# Patient Record
Sex: Male | Born: 1950 | Race: White | Hispanic: No | Marital: Single | Smoking: Never smoker
Health system: Southern US, Community
[De-identification: ages and names within clinical notes are randomized; demographics above are authoritative.]

## PROBLEM LIST (undated history)

## (undated) DIAGNOSIS — I10 Essential (primary) hypertension: Secondary | ICD-10-CM

## (undated) DIAGNOSIS — G8929 Other chronic pain: Secondary | ICD-10-CM

## (undated) DIAGNOSIS — F1010 Alcohol abuse, uncomplicated: Secondary | ICD-10-CM

## (undated) DIAGNOSIS — N200 Calculus of kidney: Secondary | ICD-10-CM

## (undated) DIAGNOSIS — M109 Gout, unspecified: Secondary | ICD-10-CM

## (undated) HISTORY — DX: Gout, unspecified: M10.9

## (undated) HISTORY — DX: Essential (primary) hypertension: I10

## (undated) HISTORY — DX: Calculus of kidney: N20.0

## (undated) HISTORY — DX: Alcohol abuse, uncomplicated: F10.10

## (undated) HISTORY — DX: Other chronic pain: G89.29

---

## 2013-08-20 ENCOUNTER — Emergency Department: Payer: Self-pay | Admitting: Emergency Medicine

## 2013-08-20 LAB — COMPREHENSIVE METABOLIC PANEL
Albumin: 3.8 g/dL (ref 3.4–5.0)
Alkaline Phosphatase: 90 U/L (ref 50–136)
Anion Gap: 9 (ref 7–16)
BUN: 12 mg/dL (ref 7–18)
Bilirubin,Total: 1.2 mg/dL — ABNORMAL HIGH (ref 0.2–1.0)
Calcium, Total: 9.4 mg/dL (ref 8.5–10.1)
Co2: 26 mmol/L (ref 21–32)
Creatinine: 1.59 mg/dL — ABNORMAL HIGH (ref 0.60–1.30)
EGFR (African American): 52 — ABNORMAL LOW
EGFR (Non-African Amer.): 45 — ABNORMAL LOW
Glucose: 143 mg/dL — ABNORMAL HIGH (ref 65–99)
Osmolality: 274 (ref 275–301)
Potassium: 3.7 mmol/L (ref 3.5–5.1)
SGPT (ALT): 74 U/L (ref 12–78)
Sodium: 136 mmol/L (ref 136–145)
Total Protein: 7.1 g/dL (ref 6.4–8.2)

## 2013-08-20 LAB — CBC
HGB: 14.9 g/dL (ref 13.0–18.0)
MCHC: 35.2 g/dL (ref 32.0–36.0)
Platelet: 76 10*3/uL — ABNORMAL LOW (ref 150–440)
RBC: 4.43 10*6/uL (ref 4.40–5.90)
RDW: 13.2 % (ref 11.5–14.5)
WBC: 4.2 10*3/uL (ref 3.8–10.6)

## 2013-08-20 LAB — URINALYSIS, COMPLETE
Bacteria: NONE SEEN
Leukocyte Esterase: NEGATIVE
Nitrite: NEGATIVE
Ph: 7 (ref 4.5–8.0)
Protein: NEGATIVE

## 2013-08-20 LAB — TROPONIN I: Troponin-I: <0.02 ng/mL

## 2013-08-20 LAB — LIPASE, BLOOD: Lipase: 130 U/L (ref 73–393)

## 2014-12-25 DIAGNOSIS — R7309 Other abnormal glucose: Secondary | ICD-10-CM | POA: Diagnosis not present

## 2014-12-25 DIAGNOSIS — Z23 Encounter for immunization: Secondary | ICD-10-CM | POA: Diagnosis not present

## 2014-12-25 DIAGNOSIS — I10 Essential (primary) hypertension: Secondary | ICD-10-CM | POA: Diagnosis not present

## 2014-12-25 DIAGNOSIS — Z125 Encounter for screening for malignant neoplasm of prostate: Secondary | ICD-10-CM | POA: Diagnosis not present

## 2014-12-25 DIAGNOSIS — N183 Chronic kidney disease, stage 3 (moderate): Secondary | ICD-10-CM | POA: Diagnosis not present

## 2014-12-25 DIAGNOSIS — H6120 Impacted cerumen, unspecified ear: Secondary | ICD-10-CM | POA: Diagnosis not present

## 2014-12-25 DIAGNOSIS — M109 Gout, unspecified: Secondary | ICD-10-CM | POA: Diagnosis not present

## 2018-01-05 DIAGNOSIS — S0990XA Unspecified injury of head, initial encounter: Secondary | ICD-10-CM | POA: Diagnosis not present

## 2018-01-05 DIAGNOSIS — R410 Disorientation, unspecified: Secondary | ICD-10-CM | POA: Diagnosis not present

## 2018-01-05 DIAGNOSIS — N179 Acute kidney failure, unspecified: Secondary | ICD-10-CM | POA: Diagnosis not present

## 2018-01-05 DIAGNOSIS — R251 Tremor, unspecified: Secondary | ICD-10-CM | POA: Diagnosis not present

## 2018-01-05 DIAGNOSIS — S0083XA Contusion of other part of head, initial encounter: Secondary | ICD-10-CM | POA: Diagnosis not present

## 2018-01-07 DIAGNOSIS — K7689 Other specified diseases of liver: Secondary | ICD-10-CM | POA: Diagnosis not present

## 2018-01-07 DIAGNOSIS — R932 Abnormal findings on diagnostic imaging of liver and biliary tract: Secondary | ICD-10-CM | POA: Diagnosis not present

## 2018-01-08 DIAGNOSIS — D696 Thrombocytopenia, unspecified: Secondary | ICD-10-CM | POA: Diagnosis not present

## 2018-01-08 DIAGNOSIS — G9349 Other encephalopathy: Secondary | ICD-10-CM | POA: Diagnosis not present

## 2018-01-08 DIAGNOSIS — N189 Chronic kidney disease, unspecified: Secondary | ICD-10-CM | POA: Diagnosis not present

## 2018-01-08 DIAGNOSIS — N179 Acute kidney failure, unspecified: Secondary | ICD-10-CM | POA: Diagnosis not present

## 2018-01-09 DIAGNOSIS — M7989 Other specified soft tissue disorders: Secondary | ICD-10-CM | POA: Diagnosis not present

## 2018-01-09 DIAGNOSIS — R918 Other nonspecific abnormal finding of lung field: Secondary | ICD-10-CM | POA: Diagnosis not present

## 2018-01-09 DIAGNOSIS — I82611 Acute embolism and thrombosis of superficial veins of right upper extremity: Secondary | ICD-10-CM | POA: Diagnosis not present

## 2018-01-09 DIAGNOSIS — R509 Fever, unspecified: Secondary | ICD-10-CM | POA: Diagnosis not present

## 2018-01-09 DIAGNOSIS — R0989 Other specified symptoms and signs involving the circulatory and respiratory systems: Secondary | ICD-10-CM | POA: Diagnosis not present

## 2018-01-10 DIAGNOSIS — M7989 Other specified soft tissue disorders: Secondary | ICD-10-CM | POA: Diagnosis not present

## 2018-01-12 DIAGNOSIS — R4182 Altered mental status, unspecified: Secondary | ICD-10-CM | POA: Diagnosis not present

## 2018-01-12 DIAGNOSIS — M25561 Pain in right knee: Secondary | ICD-10-CM | POA: Diagnosis not present

## 2018-01-12 DIAGNOSIS — I82612 Acute embolism and thrombosis of superficial veins of left upper extremity: Secondary | ICD-10-CM | POA: Diagnosis not present

## 2018-01-12 DIAGNOSIS — M25562 Pain in left knee: Secondary | ICD-10-CM | POA: Diagnosis not present

## 2018-01-12 DIAGNOSIS — R509 Fever, unspecified: Secondary | ICD-10-CM | POA: Diagnosis not present

## 2018-01-13 DIAGNOSIS — Z4682 Encounter for fitting and adjustment of non-vascular catheter: Secondary | ICD-10-CM | POA: Diagnosis not present

## 2018-01-18 DIAGNOSIS — R279 Unspecified lack of coordination: Secondary | ICD-10-CM | POA: Diagnosis not present

## 2018-01-18 DIAGNOSIS — M6281 Muscle weakness (generalized): Secondary | ICD-10-CM | POA: Diagnosis not present

## 2018-01-18 DIAGNOSIS — R5381 Other malaise: Secondary | ICD-10-CM | POA: Diagnosis not present

## 2018-01-18 DIAGNOSIS — M79672 Pain in left foot: Secondary | ICD-10-CM | POA: Diagnosis not present

## 2018-01-30 DIAGNOSIS — M109 Gout, unspecified: Secondary | ICD-10-CM | POA: Diagnosis not present

## 2018-01-30 DIAGNOSIS — N183 Chronic kidney disease, stage 3 (moderate): Secondary | ICD-10-CM | POA: Diagnosis not present

## 2018-01-30 DIAGNOSIS — Z125 Encounter for screening for malignant neoplasm of prostate: Secondary | ICD-10-CM | POA: Diagnosis not present

## 2018-08-08 DIAGNOSIS — R7303 Prediabetes: Secondary | ICD-10-CM | POA: Diagnosis not present

## 2018-08-08 DIAGNOSIS — M109 Gout, unspecified: Secondary | ICD-10-CM | POA: Diagnosis not present

## 2018-08-08 DIAGNOSIS — N183 Chronic kidney disease, stage 3 (moderate): Secondary | ICD-10-CM | POA: Diagnosis not present

## 2018-08-08 DIAGNOSIS — I10 Essential (primary) hypertension: Secondary | ICD-10-CM | POA: Diagnosis not present

## 2018-08-24 DIAGNOSIS — N183 Chronic kidney disease, stage 3 (moderate): Secondary | ICD-10-CM | POA: Diagnosis not present

## 2018-08-24 DIAGNOSIS — D696 Thrombocytopenia, unspecified: Secondary | ICD-10-CM | POA: Diagnosis not present

## 2019-06-07 ENCOUNTER — Other Ambulatory Visit: Payer: Self-pay

## 2019-11-24 ENCOUNTER — Other Ambulatory Visit: Payer: Self-pay

## 2019-11-24 ENCOUNTER — Emergency Department: Payer: Medicare Other

## 2019-11-24 ENCOUNTER — Encounter: Payer: Self-pay | Admitting: Intensive Care

## 2019-11-24 ENCOUNTER — Emergency Department
Admission: EM | Admit: 2019-11-24 | Discharge: 2019-11-24 | Disposition: A | Discharge status: Other Acute Inpatient Hospital | Payer: Medicare Other | Attending: Emergency Medicine | Admitting: Emergency Medicine

## 2019-11-24 DIAGNOSIS — W1839XA Other fall on same level, initial encounter: Secondary | ICD-10-CM | POA: Diagnosis not present

## 2019-11-24 DIAGNOSIS — R4701 Aphasia: Secondary | ICD-10-CM | POA: Diagnosis present

## 2019-11-24 DIAGNOSIS — N200 Calculus of kidney: Secondary | ICD-10-CM | POA: Diagnosis not present

## 2019-11-24 DIAGNOSIS — Y33XXXA Other specified events, undetermined intent, initial encounter: Secondary | ICD-10-CM | POA: Diagnosis not present

## 2019-11-24 DIAGNOSIS — R4182 Altered mental status, unspecified: Secondary | ICD-10-CM | POA: Insufficient documentation

## 2019-11-24 DIAGNOSIS — S065X0A Traumatic subdural hemorrhage without loss of consciousness, initial encounter: Secondary | ICD-10-CM | POA: Insufficient documentation

## 2019-11-24 DIAGNOSIS — R918 Other nonspecific abnormal finding of lung field: Secondary | ICD-10-CM | POA: Diagnosis not present

## 2019-11-24 DIAGNOSIS — Y999 Unspecified external cause status: Secondary | ICD-10-CM | POA: Insufficient documentation

## 2019-11-24 DIAGNOSIS — U071 COVID-19: Secondary | ICD-10-CM | POA: Insufficient documentation

## 2019-11-24 DIAGNOSIS — Y929 Unspecified place or not applicable: Secondary | ICD-10-CM | POA: Diagnosis not present

## 2019-11-24 DIAGNOSIS — K760 Fatty (change of) liver, not elsewhere classified: Secondary | ICD-10-CM | POA: Diagnosis not present

## 2019-11-24 DIAGNOSIS — I6200 Nontraumatic subdural hemorrhage, unspecified: Secondary | ICD-10-CM | POA: Diagnosis not present

## 2019-11-24 DIAGNOSIS — S065XAA Traumatic subdural hemorrhage with loss of consciousness status unknown, initial encounter: Secondary | ICD-10-CM

## 2019-11-24 DIAGNOSIS — F1010 Alcohol abuse, uncomplicated: Secondary | ICD-10-CM | POA: Diagnosis not present

## 2019-11-24 DIAGNOSIS — S299XXA Unspecified injury of thorax, initial encounter: Secondary | ICD-10-CM | POA: Diagnosis not present

## 2019-11-24 DIAGNOSIS — W19XXXA Unspecified fall, initial encounter: Secondary | ICD-10-CM | POA: Diagnosis not present

## 2019-11-24 DIAGNOSIS — S065X9A Traumatic subdural hemorrhage with loss of consciousness of unspecified duration, initial encounter: Secondary | ICD-10-CM

## 2019-11-24 DIAGNOSIS — Y939 Activity, unspecified: Secondary | ICD-10-CM | POA: Insufficient documentation

## 2019-11-24 DIAGNOSIS — K5790 Diverticulosis of intestine, part unspecified, without perforation or abscess without bleeding: Secondary | ICD-10-CM | POA: Diagnosis not present

## 2019-11-24 DIAGNOSIS — S3993XA Unspecified injury of pelvis, initial encounter: Secondary | ICD-10-CM | POA: Diagnosis not present

## 2019-11-24 DIAGNOSIS — S3991XA Unspecified injury of abdomen, initial encounter: Secondary | ICD-10-CM | POA: Diagnosis not present

## 2019-11-24 HISTORY — DX: Alcohol abuse, uncomplicated: F10.10

## 2019-11-24 LAB — COMPREHENSIVE METABOLIC PANEL
ALT: 36 U/L (ref 0–44)
AST: 59 U/L — ABNORMAL HIGH (ref 15–41)
Albumin: 3.4 g/dL — ABNORMAL LOW (ref 3.5–5.0)
Alkaline Phosphatase: 117 U/L (ref 38–126)
Anion gap: 15 (ref 5–15)
BUN: 21 mg/dL (ref 8–23)
CO2: 25 mmol/L (ref 22–32)
Calcium: 8.6 mg/dL — ABNORMAL LOW (ref 8.9–10.3)
Chloride: 92 mmol/L — ABNORMAL LOW (ref 98–111)
Creatinine, Ser: 1.36 mg/dL — ABNORMAL HIGH (ref 0.61–1.24)
GFR calc Af Amer: >60 mL/min (ref >60)
GFR calc non Af Amer: 52 mL/min — ABNORMAL LOW (ref >60)
Glucose, Bld: 106 mg/dL — ABNORMAL HIGH (ref 70–99)
Potassium: 3.3 mmol/L — ABNORMAL LOW (ref 3.5–5.1)
Sodium: 132 mmol/L — ABNORMAL LOW (ref 135–145)
Total Bilirubin: 3.2 mg/dL — ABNORMAL HIGH (ref 0.3–1.2)
Total Protein: 6.5 g/dL (ref 6.5–8.1)

## 2019-11-24 LAB — CBC WITH DIFFERENTIAL/PLATELET
Abs Immature Granulocytes: 0.04 10*3/uL (ref 0.00–0.07)
Basophils Absolute: 0 10*3/uL (ref 0.0–0.1)
Basophils Relative: 0 %
Eosinophils Absolute: 0 10*3/uL (ref 0.0–0.5)
Eosinophils Relative: 0 %
HCT: 30.7 % — ABNORMAL LOW (ref 39.0–52.0)
Hemoglobin: 10.8 g/dL — ABNORMAL LOW (ref 13.0–17.0)
Immature Granulocytes: 1 %
Lymphocytes Relative: 9 %
Lymphs Abs: 0.7 10*3/uL (ref 0.7–4.0)
MCH: 35.5 pg — ABNORMAL HIGH (ref 26.0–34.0)
MCHC: 35.2 g/dL (ref 30.0–36.0)
MCV: 101 fL — ABNORMAL HIGH (ref 80.0–100.0)
Monocytes Absolute: 0.5 10*3/uL (ref 0.1–1.0)
Monocytes Relative: 8 %
Neutro Abs: 5.7 10*3/uL (ref 1.7–7.7)
Neutrophils Relative %: 82 %
Platelets: 63 10*3/uL — ABNORMAL LOW (ref 150–400)
RBC: 3.04 MIL/uL — ABNORMAL LOW (ref 4.22–5.81)
RDW: 14.2 % (ref 11.5–15.5)
WBC: 6.9 10*3/uL (ref 4.0–10.5)
nRBC: 0 % (ref 0.0–0.2)

## 2019-11-24 LAB — RESPIRATORY PANEL BY RT PCR (FLU A&B, COVID)
Influenza A by PCR: NEGATIVE
Influenza B by PCR: NEGATIVE
SARS Coronavirus 2 by RT PCR: POSITIVE — AB

## 2019-11-24 LAB — TYPE AND SCREEN
ABO/RH(D): O POS
Antibody Screen: NEGATIVE

## 2019-11-24 LAB — PROTIME-INR
INR: 1.1 (ref 0.8–1.2)
Prothrombin Time: 13.8 seconds (ref 11.4–15.2)

## 2019-11-24 LAB — ABO/RH: ABO/RH(D): O POS

## 2019-11-24 LAB — AMMONIA: Ammonia: 26 umol/L (ref 9–35)

## 2019-11-24 LAB — TROPONIN I (HIGH SENSITIVITY): Troponin I (High Sensitivity): 19 ng/L — ABNORMAL HIGH (ref <18)

## 2019-11-24 LAB — APTT: aPTT: 28 seconds (ref 24–36)

## 2019-11-24 LAB — CK: Total CK: 635 U/L — ABNORMAL HIGH (ref 49–397)

## 2019-11-24 MED ORDER — SODIUM CHLORIDE 0.9 % IV SOLN
10.0000 mL/h | Freq: Once | INTRAVENOUS | Status: AC
  Administered 2019-11-24: 10 mL/h via INTRAVENOUS

## 2019-11-24 MED ORDER — LORAZEPAM 2 MG/ML IJ SOLN
1.0000 mg | Freq: Once | INTRAMUSCULAR | Status: AC
  Administered 2019-11-24: 18:00:00 1 mg via INTRAVENOUS
  Filled 2019-11-24: qty 1

## 2019-11-24 MED ORDER — LORAZEPAM 2 MG/ML IJ SOLN
1.0000 mg | Freq: Once | INTRAMUSCULAR | Status: AC
  Administered 2019-11-24: 13:00:00 1 mg via INTRAVENOUS
  Filled 2019-11-24: qty 1

## 2019-11-24 NOTE — ED Notes (Signed)
Patient transported to CT 

## 2019-11-24 NOTE — ED Notes (Signed)
Transport to [facility] on scene. Pt is unable to sign. Report given to er charge mick

## 2019-11-24 NOTE — ED Provider Notes (Signed)
[facility] Emergency Department Provider Note  ____________________________________________   I have reviewed the triage vital signs and the nursing notes.   HISTORY  Chief Complaint Aphasia   History limited by and level 5 caveat due to: AMS, slurred speech   HPI Damon Barton is a 69 y.o. male who presents to the emergency department today because of concern for slurred speech and fall. The patient himself does have slurred speech, and appears altered, cannot give any history. Per report the patient suffered a fall 2 days ago. Wife just noticed change in speech yesterday. The patient apparently has history of alcohol use.   Records reviewed. Per medical record review patient has a history of alcohol abuse. Admission for DTs in the past.   Past Medical History:  Diagnosis Date  . Alcohol abuse     There are no problems to display for this patient.   History reviewed. No pertinent surgical history.  Prior to Admission medications   Not on File    Allergies Patient has no allergy information on record.  History reviewed. No pertinent family history.  Social History Social History   Tobacco Use  . Smoking status: Not on file  Substance Use Topics  . Alcohol use: Yes  . Drug use: Not on file    Review of Systems Unable to obtain ROS ____________________________________________   PHYSICAL EXAM:  VITAL SIGNS: ED Triage Vitals  Enc Vitals Group     BP 11/24/19 1048 (!) 149/82     Pulse Rate 11/24/19 1048 80     Resp 11/24/19 1048 (!) 22     Temp 11/24/19 1048 98.3 F (36.8 C)     Temp Source 11/24/19 1048 Oral     SpO2 11/24/19 1048 97 %     Weight 11/24/19 1051 190 lb (86.2 kg)     Height 11/24/19 1051 5\' 7"  (1.702 m)   Constitutional: Awake. Slurring speech. Unclear level of orientation.  Eyes: Periorbital swelling to left eye with subconjunctival hemorrhage.  ENT      Head: Normocephalic      Nose: No  congestion/rhinnorhea.      Mouth/Throat: Mucous membranes are moist.      Neck: No stridor. Hematological/Lymphatic/Immunilogical: No cervical lymphadenopathy. Cardiovascular: Normal rate, regular rhythm.  No murmurs, rubs, or gallops.  Respiratory: Normal respiratory effort without tachypnea nor retractions. Breath sounds are clear and equal bilaterally. No wheezes/rales/rhonchi. Gastrointestinal: Soft and non tender. No rebound. No guarding.  Genitourinary: Deferred Musculoskeletal: Normal range of motion in all extremities. No lower extremity edema. No upper or lower extremity tenderness or deformity.  Neurologic:  Slurred speech. Appears to be moving all extremities.  Skin:  Diffuse bruising over much of the patients body.   ____________________________________________    LABS (pertinent positives/negatives)  Ammonia 26 INR 1.1 CK 635 Trop hs 19 CMP na 132, k 3.3, glu 106, cr 1.36, t bili 3.2 CBC wbc 6.9, hgb 10.8, plt 63 COVID positive ____________________________________________   EKG  I, , attending physician, personally viewed and interpreted this EKG  EKG Time: 1041 Rate: 85 Rhythm: sinus rhythm with pacs Axis: normal Intervals: qtc 464 QRS: narrow ST changes: no st elevation Impression: abnormal ekg   ____________________________________________    RADIOLOGY  CT head/cervical spine/max face Right sided subdural hematoma some compression of right ventricle. No osseous abnormality.  ____________________________________________   PROCEDURES  Procedures  CRITICAL CARE Performed by: Phineas Semen   Total critical care time: 40 minutes  Critical care time  was exclusive of separately billable procedures and treating other patients.  Critical care was necessary to treat or prevent imminent or life-threatening deterioration.  Critical care was time spent personally by me on the following activities: development of treatment plan  with patient and/or surrogate as well as nursing, discussions with consultants, evaluation of patient's response to treatment, examination of patient, obtaining history from patient or surrogate, ordering and performing treatments and interventions, ordering and review of laboratory studies, ordering and review of radiographic studies, pulse oximetry and re-evaluation of patient's condition.  ____________________________________________   INITIAL IMPRESSION / ASSESSMENT AND PLAN / ED COURSE  Pertinent labs & imaging results that were available during my care of the patient were reviewed by me and considered in my medical decision making (see chart for details).   Patient presented to the emergency department today because of concerns for slurred speech, altered mental status after a fall 2 days ago.  His neurologic symptoms started yesterday.  Per chart review patient has a history of significant alcohol abuse.  On exam patient has diffuse bruising throughout most of his body.  He does have some swelling around his left eye.  CT head, max face and cervical spine was obtained.  This was significant for a right sided subdural hematoma.  No osseous injuries.  Given bleed and altered mental status patient was arranged to be transferred to [facility] for higher level of care.  Patient also came back positive for coronavirus.  Additionally his platelet count was low which is consistent with previous blood work.  Patient will be transfused platelets. ____________________________________________   FINAL CLINICAL IMPRESSION(S) / ED DIAGNOSES  Final diagnoses:  Subdural hematoma (HCC)  Altered mental status, unspecified altered mental status type  COVID-19     Note: This dictation was prepared with Dragon dictation. Any transcriptional errors that result from this process are unintentional     Nance Pear, MD 11/24/19 1450

## 2019-11-24 NOTE — ED Notes (Signed)
emtala reviewed by this RN 

## 2019-11-24 NOTE — ED Provider Notes (Signed)
Patient accepted to [facility] [place] [facility]. I discussed with pt's family who is aware and in agreement with plan. No signs of significant EtOH withdrawal. He is confused but protecting airway.     Shaune Pollack, MD 11/24/19 1744

## 2019-11-24 NOTE — ED Triage Notes (Signed)
Patient arrived by EMS from home for slurred speech. Patient is heavy drinker per wife and fell Friday. Bruises noted all over face and body. Patient follows some commands but unable to understand slurred speech. Per wife slurred speech started Saturday.

## 2019-11-24 NOTE — ED Notes (Signed)
Wife has not attempted to contact us for update. The number we have on file is disconnected. I found some phone numbers from a few years ago to a son and daughter. I called both numbers and requested they call back.

## 2019-11-25 DIAGNOSIS — W1839XA Other fall on same level, initial encounter: Secondary | ICD-10-CM | POA: Diagnosis not present

## 2019-11-25 DIAGNOSIS — D696 Thrombocytopenia, unspecified: Secondary | ICD-10-CM | POA: Diagnosis not present

## 2019-11-25 DIAGNOSIS — S065X0A Traumatic subdural hemorrhage without loss of consciousness, initial encounter: Secondary | ICD-10-CM | POA: Diagnosis not present

## 2019-11-25 DIAGNOSIS — W19XXXA Unspecified fall, initial encounter: Secondary | ICD-10-CM | POA: Diagnosis not present

## 2019-11-25 DIAGNOSIS — I6200 Nontraumatic subdural hemorrhage, unspecified: Secondary | ICD-10-CM | POA: Diagnosis not present

## 2019-11-25 DIAGNOSIS — S065X9A Traumatic subdural hemorrhage with loss of consciousness of unspecified duration, initial encounter: Secondary | ICD-10-CM | POA: Diagnosis not present

## 2019-11-25 DIAGNOSIS — J9811 Atelectasis: Secondary | ICD-10-CM | POA: Diagnosis not present

## 2019-11-25 DIAGNOSIS — R1312 Dysphagia, oropharyngeal phase: Secondary | ICD-10-CM | POA: Diagnosis not present

## 2019-11-25 DIAGNOSIS — R4182 Altered mental status, unspecified: Secondary | ICD-10-CM | POA: Diagnosis not present

## 2019-11-25 LAB — BPAM PLATELET PHERESIS
Blood Product Expiration Date: 202101192359
ISSUE DATE / TIME: 202101171322
Unit Type and Rh: 9500

## 2019-11-25 LAB — PREPARE PLATELET PHERESIS: Unit division: 0

## 2019-11-26 DIAGNOSIS — S065X0A Traumatic subdural hemorrhage without loss of consciousness, initial encounter: Secondary | ICD-10-CM | POA: Diagnosis not present

## 2019-11-26 DIAGNOSIS — R1312 Dysphagia, oropharyngeal phase: Secondary | ICD-10-CM | POA: Diagnosis not present

## 2019-11-26 DIAGNOSIS — S065X9A Traumatic subdural hemorrhage with loss of consciousness of unspecified duration, initial encounter: Secondary | ICD-10-CM | POA: Diagnosis not present

## 2019-11-26 DIAGNOSIS — R4182 Altered mental status, unspecified: Secondary | ICD-10-CM | POA: Diagnosis not present

## 2019-11-26 DIAGNOSIS — W1839XA Other fall on same level, initial encounter: Secondary | ICD-10-CM | POA: Diagnosis not present

## 2019-11-26 DIAGNOSIS — J9811 Atelectasis: Secondary | ICD-10-CM | POA: Diagnosis not present

## 2019-11-26 DIAGNOSIS — D696 Thrombocytopenia, unspecified: Secondary | ICD-10-CM | POA: Diagnosis not present

## 2019-11-26 DIAGNOSIS — W19XXXA Unspecified fall, initial encounter: Secondary | ICD-10-CM | POA: Diagnosis not present

## 2019-11-27 DIAGNOSIS — S065X0A Traumatic subdural hemorrhage without loss of consciousness, initial encounter: Secondary | ICD-10-CM | POA: Diagnosis not present

## 2019-11-27 DIAGNOSIS — R1312 Dysphagia, oropharyngeal phase: Secondary | ICD-10-CM | POA: Diagnosis not present

## 2019-11-27 DIAGNOSIS — W19XXXA Unspecified fall, initial encounter: Secondary | ICD-10-CM | POA: Diagnosis not present

## 2019-11-27 DIAGNOSIS — R4182 Altered mental status, unspecified: Secondary | ICD-10-CM | POA: Diagnosis not present

## 2019-11-27 DIAGNOSIS — D696 Thrombocytopenia, unspecified: Secondary | ICD-10-CM | POA: Diagnosis not present

## 2019-11-27 DIAGNOSIS — J9811 Atelectasis: Secondary | ICD-10-CM | POA: Diagnosis not present

## 2019-11-28 DIAGNOSIS — J9811 Atelectasis: Secondary | ICD-10-CM | POA: Diagnosis not present

## 2019-11-28 DIAGNOSIS — R4182 Altered mental status, unspecified: Secondary | ICD-10-CM | POA: Diagnosis not present

## 2019-11-28 DIAGNOSIS — R1312 Dysphagia, oropharyngeal phase: Secondary | ICD-10-CM | POA: Diagnosis not present

## 2019-11-28 DIAGNOSIS — S065X0A Traumatic subdural hemorrhage without loss of consciousness, initial encounter: Secondary | ICD-10-CM | POA: Diagnosis not present

## 2019-11-28 DIAGNOSIS — R400 Somnolence: Secondary | ICD-10-CM | POA: Diagnosis not present

## 2019-11-28 DIAGNOSIS — W19XXXA Unspecified fall, initial encounter: Secondary | ICD-10-CM | POA: Diagnosis not present

## 2019-11-28 DIAGNOSIS — D696 Thrombocytopenia, unspecified: Secondary | ICD-10-CM | POA: Diagnosis not present

## 2019-11-29 DIAGNOSIS — R1312 Dysphagia, oropharyngeal phase: Secondary | ICD-10-CM | POA: Diagnosis not present

## 2019-11-29 DIAGNOSIS — J9811 Atelectasis: Secondary | ICD-10-CM | POA: Diagnosis not present

## 2019-11-29 DIAGNOSIS — S065X0A Traumatic subdural hemorrhage without loss of consciousness, initial encounter: Secondary | ICD-10-CM | POA: Diagnosis not present

## 2019-11-29 DIAGNOSIS — R4182 Altered mental status, unspecified: Secondary | ICD-10-CM | POA: Diagnosis not present

## 2019-11-29 DIAGNOSIS — D696 Thrombocytopenia, unspecified: Secondary | ICD-10-CM | POA: Diagnosis not present

## 2019-11-29 DIAGNOSIS — W19XXXA Unspecified fall, initial encounter: Secondary | ICD-10-CM | POA: Diagnosis not present

## 2019-11-30 DIAGNOSIS — R400 Somnolence: Secondary | ICD-10-CM | POA: Diagnosis not present

## 2019-12-02 DIAGNOSIS — Z4659 Encounter for fitting and adjustment of other gastrointestinal appliance and device: Secondary | ICD-10-CM | POA: Diagnosis not present

## 2019-12-04 DIAGNOSIS — M25551 Pain in right hip: Secondary | ICD-10-CM | POA: Diagnosis not present

## 2019-12-04 DIAGNOSIS — S065X9A Traumatic subdural hemorrhage with loss of consciousness of unspecified duration, initial encounter: Secondary | ICD-10-CM | POA: Diagnosis not present

## 2019-12-04 DIAGNOSIS — B9561 Methicillin susceptible Staphylococcus aureus infection as the cause of diseases classified elsewhere: Secondary | ICD-10-CM | POA: Diagnosis not present

## 2019-12-04 DIAGNOSIS — R7881 Bacteremia: Secondary | ICD-10-CM | POA: Diagnosis not present

## 2019-12-04 DIAGNOSIS — U071 COVID-19: Secondary | ICD-10-CM | POA: Diagnosis not present

## 2019-12-05 DIAGNOSIS — B9561 Methicillin susceptible Staphylococcus aureus infection as the cause of diseases classified elsewhere: Secondary | ICD-10-CM | POA: Diagnosis not present

## 2019-12-05 DIAGNOSIS — M25551 Pain in right hip: Secondary | ICD-10-CM | POA: Diagnosis not present

## 2019-12-05 DIAGNOSIS — U071 COVID-19: Secondary | ICD-10-CM | POA: Diagnosis not present

## 2019-12-05 DIAGNOSIS — Z4659 Encounter for fitting and adjustment of other gastrointestinal appliance and device: Secondary | ICD-10-CM | POA: Diagnosis not present

## 2019-12-05 DIAGNOSIS — M25559 Pain in unspecified hip: Secondary | ICD-10-CM | POA: Diagnosis not present

## 2019-12-05 DIAGNOSIS — R7881 Bacteremia: Secondary | ICD-10-CM | POA: Diagnosis not present

## 2019-12-06 DIAGNOSIS — W19XXXA Unspecified fall, initial encounter: Secondary | ICD-10-CM | POA: Diagnosis not present

## 2019-12-06 DIAGNOSIS — M25551 Pain in right hip: Secondary | ICD-10-CM | POA: Diagnosis not present

## 2019-12-06 DIAGNOSIS — B9561 Methicillin susceptible Staphylococcus aureus infection as the cause of diseases classified elsewhere: Secondary | ICD-10-CM | POA: Diagnosis not present

## 2019-12-06 DIAGNOSIS — M79651 Pain in right thigh: Secondary | ICD-10-CM | POA: Diagnosis not present

## 2019-12-06 DIAGNOSIS — U071 COVID-19: Secondary | ICD-10-CM | POA: Diagnosis not present

## 2019-12-06 DIAGNOSIS — R7881 Bacteremia: Secondary | ICD-10-CM | POA: Diagnosis not present

## 2019-12-07 DIAGNOSIS — R7881 Bacteremia: Secondary | ICD-10-CM | POA: Diagnosis not present

## 2019-12-07 DIAGNOSIS — L02413 Cutaneous abscess of right upper limb: Secondary | ICD-10-CM | POA: Diagnosis not present

## 2019-12-07 DIAGNOSIS — M545 Low back pain: Secondary | ICD-10-CM | POA: Diagnosis not present

## 2019-12-07 DIAGNOSIS — M25451 Effusion, right hip: Secondary | ICD-10-CM | POA: Diagnosis not present

## 2019-12-07 DIAGNOSIS — M48061 Spinal stenosis, lumbar region without neurogenic claudication: Secondary | ICD-10-CM | POA: Diagnosis not present

## 2019-12-07 DIAGNOSIS — M609 Myositis, unspecified: Secondary | ICD-10-CM | POA: Diagnosis not present

## 2019-12-08 DIAGNOSIS — R7881 Bacteremia: Secondary | ICD-10-CM | POA: Diagnosis not present

## 2019-12-09 DIAGNOSIS — R7881 Bacteremia: Secondary | ICD-10-CM | POA: Diagnosis not present

## 2019-12-09 DIAGNOSIS — Z4659 Encounter for fitting and adjustment of other gastrointestinal appliance and device: Secondary | ICD-10-CM | POA: Diagnosis not present

## 2019-12-09 DIAGNOSIS — R918 Other nonspecific abnormal finding of lung field: Secondary | ICD-10-CM | POA: Diagnosis not present

## 2019-12-10 DIAGNOSIS — W19XXXA Unspecified fall, initial encounter: Secondary | ICD-10-CM | POA: Diagnosis not present

## 2019-12-10 DIAGNOSIS — S065X9A Traumatic subdural hemorrhage with loss of consciousness of unspecified duration, initial encounter: Secondary | ICD-10-CM | POA: Diagnosis not present

## 2019-12-10 DIAGNOSIS — R1312 Dysphagia, oropharyngeal phase: Secondary | ICD-10-CM | POA: Diagnosis not present

## 2019-12-10 DIAGNOSIS — R7881 Bacteremia: Secondary | ICD-10-CM | POA: Diagnosis not present

## 2019-12-11 DIAGNOSIS — R7881 Bacteremia: Secondary | ICD-10-CM | POA: Diagnosis not present

## 2019-12-12 DIAGNOSIS — S065X9A Traumatic subdural hemorrhage with loss of consciousness of unspecified duration, initial encounter: Secondary | ICD-10-CM | POA: Diagnosis not present

## 2019-12-12 DIAGNOSIS — R918 Other nonspecific abnormal finding of lung field: Secondary | ICD-10-CM | POA: Diagnosis not present

## 2019-12-12 DIAGNOSIS — R7881 Bacteremia: Secondary | ICD-10-CM | POA: Diagnosis not present

## 2019-12-12 DIAGNOSIS — Z452 Encounter for adjustment and management of vascular access device: Secondary | ICD-10-CM | POA: Diagnosis not present

## 2019-12-12 DIAGNOSIS — R1312 Dysphagia, oropharyngeal phase: Secondary | ICD-10-CM | POA: Diagnosis not present

## 2019-12-12 DIAGNOSIS — Y33XXXA Other specified events, undetermined intent, initial encounter: Secondary | ICD-10-CM | POA: Diagnosis not present

## 2019-12-12 DIAGNOSIS — U071 COVID-19: Secondary | ICD-10-CM | POA: Diagnosis not present

## 2019-12-13 DIAGNOSIS — R918 Other nonspecific abnormal finding of lung field: Secondary | ICD-10-CM | POA: Diagnosis not present

## 2019-12-13 DIAGNOSIS — S065X9A Traumatic subdural hemorrhage with loss of consciousness of unspecified duration, initial encounter: Secondary | ICD-10-CM | POA: Diagnosis not present

## 2019-12-13 DIAGNOSIS — R7881 Bacteremia: Secondary | ICD-10-CM | POA: Diagnosis not present

## 2019-12-13 DIAGNOSIS — Z452 Encounter for adjustment and management of vascular access device: Secondary | ICD-10-CM | POA: Diagnosis not present

## 2019-12-13 DIAGNOSIS — R7401 Elevation of levels of liver transaminase levels: Secondary | ICD-10-CM | POA: Diagnosis not present

## 2019-12-13 DIAGNOSIS — Z4659 Encounter for fitting and adjustment of other gastrointestinal appliance and device: Secondary | ICD-10-CM | POA: Diagnosis not present

## 2019-12-13 DIAGNOSIS — Y33XXXA Other specified events, undetermined intent, initial encounter: Secondary | ICD-10-CM | POA: Diagnosis not present

## 2019-12-14 DIAGNOSIS — Z452 Encounter for adjustment and management of vascular access device: Secondary | ICD-10-CM | POA: Diagnosis not present

## 2019-12-14 DIAGNOSIS — R918 Other nonspecific abnormal finding of lung field: Secondary | ICD-10-CM | POA: Diagnosis not present

## 2019-12-14 DIAGNOSIS — R1312 Dysphagia, oropharyngeal phase: Secondary | ICD-10-CM | POA: Diagnosis not present

## 2019-12-15 DIAGNOSIS — M7020 Olecranon bursitis, unspecified elbow: Secondary | ICD-10-CM | POA: Diagnosis not present

## 2019-12-15 DIAGNOSIS — R918 Other nonspecific abnormal finding of lung field: Secondary | ICD-10-CM | POA: Diagnosis not present

## 2019-12-15 DIAGNOSIS — M7989 Other specified soft tissue disorders: Secondary | ICD-10-CM | POA: Diagnosis not present

## 2019-12-15 DIAGNOSIS — M25422 Effusion, left elbow: Secondary | ICD-10-CM | POA: Diagnosis not present

## 2019-12-15 DIAGNOSIS — R1312 Dysphagia, oropharyngeal phase: Secondary | ICD-10-CM | POA: Diagnosis not present

## 2019-12-16 DIAGNOSIS — R5381 Other malaise: Secondary | ICD-10-CM | POA: Diagnosis not present

## 2019-12-16 DIAGNOSIS — R400 Somnolence: Secondary | ICD-10-CM | POA: Diagnosis not present

## 2019-12-16 DIAGNOSIS — R1312 Dysphagia, oropharyngeal phase: Secondary | ICD-10-CM | POA: Diagnosis not present

## 2019-12-16 DIAGNOSIS — Z515 Encounter for palliative care: Secondary | ICD-10-CM | POA: Diagnosis not present

## 2019-12-16 DIAGNOSIS — M7989 Other specified soft tissue disorders: Secondary | ICD-10-CM | POA: Diagnosis not present

## 2019-12-17 DIAGNOSIS — R1312 Dysphagia, oropharyngeal phase: Secondary | ICD-10-CM | POA: Diagnosis not present

## 2019-12-17 DIAGNOSIS — Z515 Encounter for palliative care: Secondary | ICD-10-CM | POA: Diagnosis not present

## 2019-12-17 DIAGNOSIS — M729 Fibroblastic disorder, unspecified: Secondary | ICD-10-CM | POA: Diagnosis not present

## 2019-12-17 DIAGNOSIS — M25451 Effusion, right hip: Secondary | ICD-10-CM | POA: Diagnosis not present

## 2019-12-17 DIAGNOSIS — R5381 Other malaise: Secondary | ICD-10-CM | POA: Diagnosis not present

## 2019-12-18 DIAGNOSIS — R1312 Dysphagia, oropharyngeal phase: Secondary | ICD-10-CM | POA: Diagnosis not present

## 2019-12-18 DIAGNOSIS — R5381 Other malaise: Secondary | ICD-10-CM | POA: Diagnosis not present

## 2019-12-18 DIAGNOSIS — Z515 Encounter for palliative care: Secondary | ICD-10-CM | POA: Diagnosis not present

## 2019-12-18 DIAGNOSIS — R918 Other nonspecific abnormal finding of lung field: Secondary | ICD-10-CM | POA: Diagnosis not present

## 2019-12-19 DIAGNOSIS — M5136 Other intervertebral disc degeneration, lumbar region: Secondary | ICD-10-CM | POA: Diagnosis not present

## 2019-12-19 DIAGNOSIS — R7881 Bacteremia: Secondary | ICD-10-CM | POA: Diagnosis not present

## 2019-12-19 DIAGNOSIS — R5381 Other malaise: Secondary | ICD-10-CM | POA: Diagnosis not present

## 2019-12-19 DIAGNOSIS — Z515 Encounter for palliative care: Secondary | ICD-10-CM | POA: Diagnosis not present

## 2019-12-19 DIAGNOSIS — M545 Low back pain: Secondary | ICD-10-CM | POA: Diagnosis not present

## 2019-12-19 DIAGNOSIS — R1312 Dysphagia, oropharyngeal phase: Secondary | ICD-10-CM | POA: Diagnosis not present

## 2019-12-20 DIAGNOSIS — R1312 Dysphagia, oropharyngeal phase: Secondary | ICD-10-CM | POA: Diagnosis not present

## 2019-12-21 DIAGNOSIS — R1312 Dysphagia, oropharyngeal phase: Secondary | ICD-10-CM | POA: Diagnosis not present

## 2019-12-21 DIAGNOSIS — R7881 Bacteremia: Secondary | ICD-10-CM | POA: Diagnosis not present

## 2019-12-22 DIAGNOSIS — R1312 Dysphagia, oropharyngeal phase: Secondary | ICD-10-CM | POA: Diagnosis not present

## 2019-12-22 DIAGNOSIS — R7881 Bacteremia: Secondary | ICD-10-CM | POA: Diagnosis not present

## 2019-12-23 DIAGNOSIS — R7881 Bacteremia: Secondary | ICD-10-CM | POA: Diagnosis not present

## 2019-12-23 DIAGNOSIS — R1312 Dysphagia, oropharyngeal phase: Secondary | ICD-10-CM | POA: Diagnosis not present

## 2019-12-24 DIAGNOSIS — R1312 Dysphagia, oropharyngeal phase: Secondary | ICD-10-CM | POA: Diagnosis not present

## 2019-12-24 DIAGNOSIS — R7881 Bacteremia: Secondary | ICD-10-CM | POA: Diagnosis not present

## 2019-12-25 DIAGNOSIS — R1312 Dysphagia, oropharyngeal phase: Secondary | ICD-10-CM | POA: Diagnosis not present

## 2019-12-25 DIAGNOSIS — R7881 Bacteremia: Secondary | ICD-10-CM | POA: Diagnosis not present

## 2019-12-26 DIAGNOSIS — R7881 Bacteremia: Secondary | ICD-10-CM | POA: Diagnosis not present

## 2019-12-26 DIAGNOSIS — R1312 Dysphagia, oropharyngeal phase: Secondary | ICD-10-CM | POA: Diagnosis not present

## 2019-12-27 DIAGNOSIS — R7881 Bacteremia: Secondary | ICD-10-CM | POA: Diagnosis not present

## 2019-12-27 DIAGNOSIS — R1312 Dysphagia, oropharyngeal phase: Secondary | ICD-10-CM | POA: Diagnosis not present

## 2019-12-28 DIAGNOSIS — R1312 Dysphagia, oropharyngeal phase: Secondary | ICD-10-CM | POA: Diagnosis not present

## 2019-12-29 DIAGNOSIS — R1312 Dysphagia, oropharyngeal phase: Secondary | ICD-10-CM | POA: Diagnosis not present

## 2019-12-30 DIAGNOSIS — R1312 Dysphagia, oropharyngeal phase: Secondary | ICD-10-CM | POA: Diagnosis not present

## 2019-12-31 DIAGNOSIS — R296 Repeated falls: Secondary | ICD-10-CM | POA: Diagnosis not present

## 2019-12-31 DIAGNOSIS — I4891 Unspecified atrial fibrillation: Secondary | ICD-10-CM | POA: Diagnosis not present

## 2019-12-31 DIAGNOSIS — G3184 Mild cognitive impairment, so stated: Secondary | ICD-10-CM | POA: Diagnosis not present

## 2019-12-31 DIAGNOSIS — Z7409 Other reduced mobility: Secondary | ICD-10-CM | POA: Diagnosis not present

## 2019-12-31 DIAGNOSIS — S81802S Unspecified open wound, left lower leg, sequela: Secondary | ICD-10-CM | POA: Diagnosis not present

## 2019-12-31 DIAGNOSIS — F10931 Alcohol use, unspecified with withdrawal delirium: Secondary | ICD-10-CM | POA: Diagnosis not present

## 2019-12-31 DIAGNOSIS — B9561 Methicillin susceptible Staphylococcus aureus infection as the cause of diseases classified elsewhere: Secondary | ICD-10-CM | POA: Diagnosis not present

## 2019-12-31 DIAGNOSIS — I10 Essential (primary) hypertension: Secondary | ICD-10-CM | POA: Diagnosis not present

## 2019-12-31 DIAGNOSIS — Z23 Encounter for immunization: Secondary | ICD-10-CM | POA: Diagnosis not present

## 2019-12-31 DIAGNOSIS — M109 Gout, unspecified: Secondary | ICD-10-CM | POA: Diagnosis not present

## 2019-12-31 DIAGNOSIS — F1011 Alcohol abuse, in remission: Secondary | ICD-10-CM | POA: Diagnosis not present

## 2019-12-31 DIAGNOSIS — S065X9D Traumatic subdural hemorrhage with loss of consciousness of unspecified duration, subsequent encounter: Secondary | ICD-10-CM | POA: Diagnosis not present

## 2019-12-31 DIAGNOSIS — R269 Unspecified abnormalities of gait and mobility: Secondary | ICD-10-CM | POA: Diagnosis not present

## 2019-12-31 DIAGNOSIS — R41841 Cognitive communication deficit: Secondary | ICD-10-CM | POA: Diagnosis not present

## 2019-12-31 DIAGNOSIS — N189 Chronic kidney disease, unspecified: Secondary | ICD-10-CM | POA: Diagnosis not present

## 2019-12-31 DIAGNOSIS — R410 Disorientation, unspecified: Secondary | ICD-10-CM | POA: Diagnosis not present

## 2019-12-31 DIAGNOSIS — F064 Anxiety disorder due to known physiological condition: Secondary | ICD-10-CM | POA: Diagnosis not present

## 2019-12-31 DIAGNOSIS — Z20822 Contact with and (suspected) exposure to covid-19: Secondary | ICD-10-CM | POA: Diagnosis not present

## 2019-12-31 DIAGNOSIS — M6281 Muscle weakness (generalized): Secondary | ICD-10-CM | POA: Diagnosis not present

## 2019-12-31 DIAGNOSIS — F331 Major depressive disorder, recurrent, moderate: Secondary | ICD-10-CM | POA: Diagnosis not present

## 2019-12-31 DIAGNOSIS — R1312 Dysphagia, oropharyngeal phase: Secondary | ICD-10-CM | POA: Diagnosis not present

## 2019-12-31 DIAGNOSIS — S065X9A Traumatic subdural hemorrhage with loss of consciousness of unspecified duration, initial encounter: Secondary | ICD-10-CM | POA: Diagnosis not present

## 2019-12-31 DIAGNOSIS — A0472 Enterocolitis due to Clostridium difficile, not specified as recurrent: Secondary | ICD-10-CM | POA: Diagnosis not present

## 2019-12-31 DIAGNOSIS — R1310 Dysphagia, unspecified: Secondary | ICD-10-CM | POA: Diagnosis not present

## 2019-12-31 DIAGNOSIS — N200 Calculus of kidney: Secondary | ICD-10-CM | POA: Diagnosis not present

## 2019-12-31 DIAGNOSIS — R279 Unspecified lack of coordination: Secondary | ICD-10-CM | POA: Diagnosis not present

## 2020-01-22 DIAGNOSIS — S065X9A Traumatic subdural hemorrhage with loss of consciousness of unspecified duration, initial encounter: Secondary | ICD-10-CM | POA: Diagnosis not present

## 2020-01-27 DIAGNOSIS — Z79899 Other long term (current) drug therapy: Secondary | ICD-10-CM | POA: Diagnosis not present

## 2020-01-27 DIAGNOSIS — N183 Chronic kidney disease, stage 3 unspecified: Secondary | ICD-10-CM | POA: Diagnosis not present

## 2020-01-27 DIAGNOSIS — Z931 Gastrostomy status: Secondary | ICD-10-CM | POA: Diagnosis not present

## 2020-01-27 DIAGNOSIS — D500 Iron deficiency anemia secondary to blood loss (chronic): Secondary | ICD-10-CM | POA: Diagnosis not present

## 2020-01-27 DIAGNOSIS — U071 COVID-19: Secondary | ICD-10-CM | POA: Diagnosis not present

## 2020-02-06 ENCOUNTER — Ambulatory Visit (INDEPENDENT_AMBULATORY_CARE_PROVIDER_SITE_OTHER): Payer: Medicare Other | Admitting: Surgery

## 2020-02-06 ENCOUNTER — Other Ambulatory Visit: Payer: Self-pay

## 2020-02-06 ENCOUNTER — Encounter: Payer: Self-pay | Admitting: Surgery

## 2020-02-06 VITALS — BP 122/82 | HR 80 | Temp 97.7°F | Ht 71.0 in | Wt 182.8 lb

## 2020-02-06 DIAGNOSIS — Z931 Gastrostomy status: Secondary | ICD-10-CM | POA: Diagnosis not present

## 2020-02-06 NOTE — Patient Instructions (Signed)
The area should close up on its own. May place a dressing over the area to protect clothing. Remove the dressing before bathing and replace afterwards.   Follow-up with our office as needed.  Please call and ask to speak with a nurse if you develop questions or concerns.

## 2020-02-06 NOTE — Progress Notes (Signed)
Patient ID: Damon Fukushima Sr., male   DOB: 05-Jan-1951, 69 y.o.   MRN: [id number]  69 year old Caucasian male presents with a replacement gastrostomy tube present for months.  History of subdural hemorrhage and fall, feeding issues associated with same.  Now eating 100% of his meals without issues.  Maintaining weight having no use for the gastrostomy tube.  Replacement tube balloon was decompressed with syringe a total of 10 mL was removed.  Balloon came out easily and a dressing applied.  Patient tolerated procedure well, instructions given regarding dressing changes cleansing in anticipation of drainage and wound healing.  He can follow-up with [place] after a month as needed, determined by healing progress and/or persistent drainage.  Past Medical History Past Medical History:  Diagnosis Date  . Alcohol abuse   . Chronic back pain   . ETOH abuse   . Gout   . Hypertension   . Kidney stones   . Nephrolithiasis       No past surgical history on file.  Allergies  Allergen Reactions  . Ace Inhibitors Other (See Comments)    headache  . Metoprolol Rash    Current Outpatient Medications  Medication Sig Dispense Refill  . acetaminophen (TYLENOL) 160 MG/5ML elixir Take 649.6 mg by mouth every 8 (eight) hours.    Marland Kitchen amLODipine (NORVASC) 5 MG tablet Take 5 mg by mouth daily.    . Carboxymethylcellulose Sod PF 0.5 % SOLN Apply 2 drops to eye in the morning, at noon, and at bedtime.    . folic acid (FOLVITE) 1 MG tablet Place 1 mg into feeding tube daily.    . melatonin 3 MG TABS tablet Take 3 mg by mouth at bedtime.    . Multiple Vitamin (MULTIVITAMIN) tablet Take 1 tablet by mouth daily.    Marland Kitchen OLANZapine (ZYPREXA) 2.5 MG tablet Place 2.5 mg into feeding tube in the morning and at bedtime.    . polyethylene glycol (MIRALAX / GLYCOLAX) 17 g packet Take 17 g by mouth daily.    Marland Kitchen thiamine 100 MG tablet Take 100 mg by mouth daily.     No current facility-administered medications for this  visit.    Family History No family history on file.    Social History Social History   Tobacco Use  . Smoking status: Never Smoker  . Smokeless tobacco: Never Used  Substance Use Topics  . Alcohol use: Yes  . Drug use: Not on file         Data Reviewed I have personally reviewed what is currently available of the patient's imaging, recent labs and medical records.   Labs:  CBC Latest Ref Rng & Units 11/24/2019 08/20/2013  WBC 4.0 - 10.5 K/uL 6.9 4.2  Hemoglobin 13.0 - 17.0 g/dL 10.8(L) 14.9  Hematocrit 39.0 - 52.0 % 30.7(L) 42.3  Platelets 150 - 400 K/uL 63(L) 76(L)   CMP Latest Ref Rng & Units 11/24/2019 08/20/2013  Glucose 70 - 99 mg/dL 627(O) 350(K)  BUN 8 - 23 mg/dL 21 12  Creatinine 9.38 - 1.24 mg/dL 1.82(X) 9.37(J)  Sodium 135 - 145 mmol/L 132(L) 136  Potassium 3.5 - 5.1 mmol/L 3.3(L) 3.7  Chloride 98 - 111 mmol/L 92(L) 101  CO2 22 - 32 mmol/L 25 26  Calcium 8.9 - 10.3 mg/dL 6.9(C) 9.4  Total Protein 6.5 - 8.1 g/dL 6.5 7.1  Total Bilirubin 0.3 - 1.2 mg/dL 3.2(H) 1.2(H)  Alkaline Phos 38 - 126 U/L 117 90  AST 15 - 41 U/L 59(H)  60(H)  ALT 0 - 44 U/L 36 74       Ronny Bacon M.D., FACS 02/06/2020, 1:16 PM

## 2020-02-27 DIAGNOSIS — M609 Myositis, unspecified: Secondary | ICD-10-CM | POA: Diagnosis not present

## 2020-02-27 DIAGNOSIS — Z139 Encounter for screening, unspecified: Secondary | ICD-10-CM | POA: Diagnosis not present

## 2020-02-27 DIAGNOSIS — I10 Essential (primary) hypertension: Secondary | ICD-10-CM | POA: Diagnosis not present

## 2020-02-27 DIAGNOSIS — R609 Edema, unspecified: Secondary | ICD-10-CM | POA: Diagnosis not present

## 2020-02-27 DIAGNOSIS — S065X9A Traumatic subdural hemorrhage with loss of consciousness of unspecified duration, initial encounter: Secondary | ICD-10-CM | POA: Diagnosis not present

## 2020-02-27 DIAGNOSIS — D500 Iron deficiency anemia secondary to blood loss (chronic): Secondary | ICD-10-CM | POA: Diagnosis not present

## 2020-04-02 DIAGNOSIS — S61419A Laceration without foreign body of unspecified hand, initial encounter: Secondary | ICD-10-CM | POA: Diagnosis not present

## 2020-04-02 DIAGNOSIS — Z23 Encounter for immunization: Secondary | ICD-10-CM | POA: Diagnosis not present

## 2020-04-02 DIAGNOSIS — W540XXA Bitten by dog, initial encounter: Secondary | ICD-10-CM | POA: Diagnosis not present

## 2020-04-02 DIAGNOSIS — S61519A Laceration without foreign body of unspecified wrist, initial encounter: Secondary | ICD-10-CM | POA: Diagnosis not present

## 2020-04-02 DIAGNOSIS — S61459A Open bite of unspecified hand, initial encounter: Secondary | ICD-10-CM | POA: Diagnosis not present

## 2020-10-16 DIAGNOSIS — Z23 Encounter for immunization: Secondary | ICD-10-CM | POA: Diagnosis not present

## 2021-08-13 ENCOUNTER — Other Ambulatory Visit (HOSPITAL_COMMUNITY): Payer: Self-pay | Admitting: Physician Assistant

## 2021-08-13 ENCOUNTER — Other Ambulatory Visit: Payer: Self-pay | Admitting: Physician Assistant

## 2021-08-13 DIAGNOSIS — R413 Other amnesia: Secondary | ICD-10-CM

## 2021-09-10 ENCOUNTER — Other Ambulatory Visit: Payer: Self-pay

## 2021-09-10 ENCOUNTER — Ambulatory Visit
Admission: RE | Admit: 2021-09-10 | Discharge: 2021-09-10 | Disposition: A | Discharge status: Home / Self Care | Payer: Medicare Other | Source: Ambulatory Visit | Attending: Physician Assistant | Admitting: Physician Assistant

## 2021-09-10 DIAGNOSIS — R413 Other amnesia: Secondary | ICD-10-CM | POA: Diagnosis present

## 2021-11-16 IMAGING — MR MR HEAD W/O CM
11 series · 44 of 48 positions shown · non-contrast
Comparison: 11/24/2019

CLINICAL DATA: Fall from tractor with head injury 6 weeks ago.
Difficulty remembering

EXAM:
MRI HEAD WITHOUT CONTRAST
TECHNIQUE: Multiplanar, multiecho pulse sequences of the brain and surrounding
structures were obtained without intravenous contrast.

[Series 5: ax dwi_tracew · axial · 3.0mm · 0.65mm/px · z∈[-68,+87]mm · 5 of 48 slices shown]
[im 1/48]
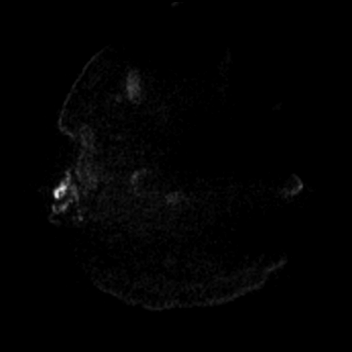
[im 12/48]
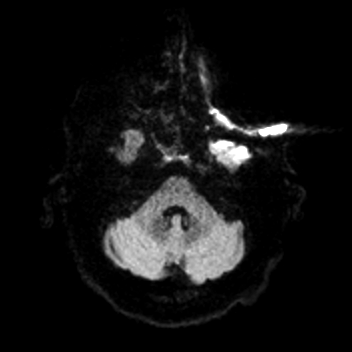
[im 24/48]
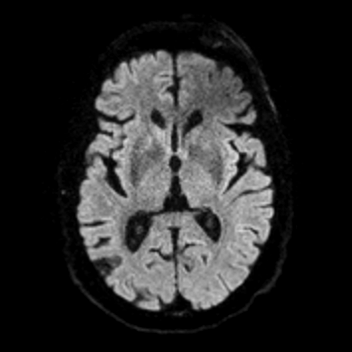
[im 36/48]
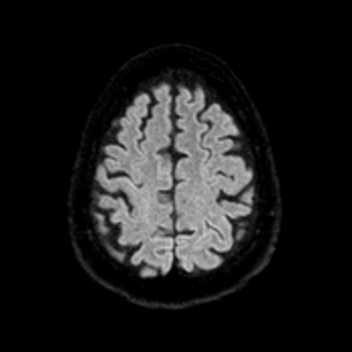
[im 48/48]
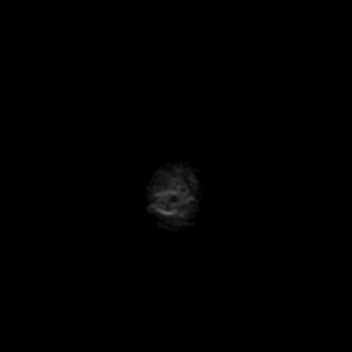

[Series 6: ax dwi_adc · axial · 3.0mm · 0.65mm/px · z∈[-68,+87]mm · 4 of 48 slices shown]
[im 1/48]
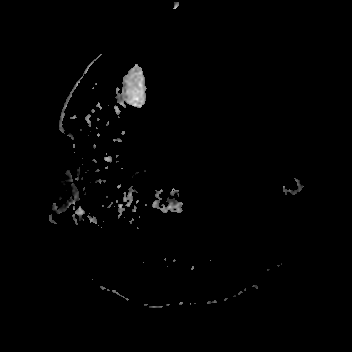
[im 16/48]
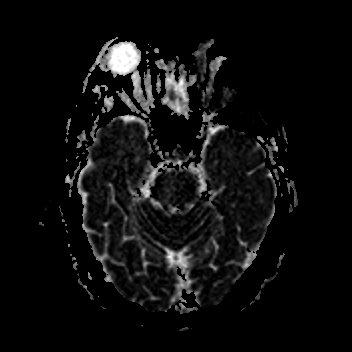
[im 32/48]
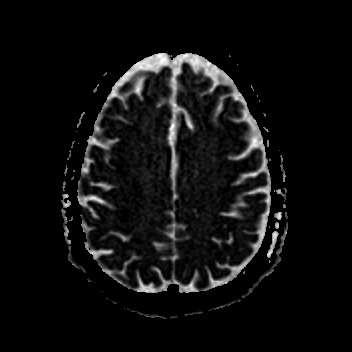
[im 48/48]
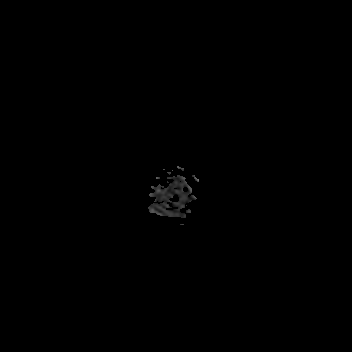

[Series 7: cor dwi_tracew · coronal · 5.0mm · 0.60mm/px · 3 of 38 slices shown]
[im 1/38]
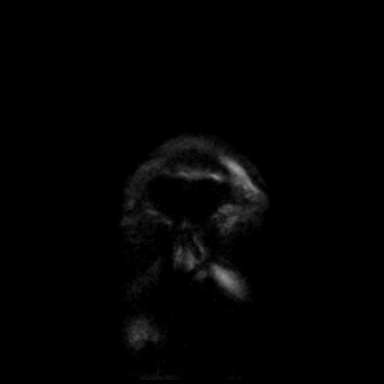
[im 19/38]
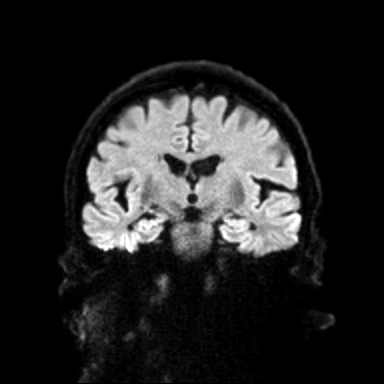
[im 38/38]
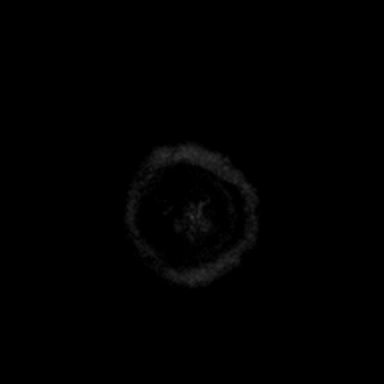

[Series 8: cor dwi_adc · coronal · 5.0mm · 0.60mm/px · 3 of 38 slices shown]
[im 1/38]
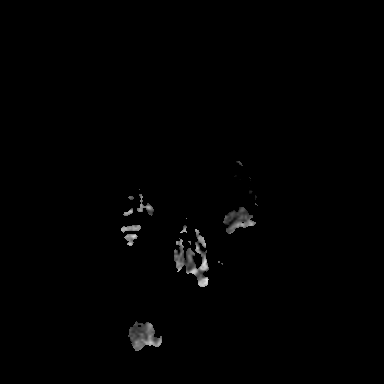
[im 19/38]
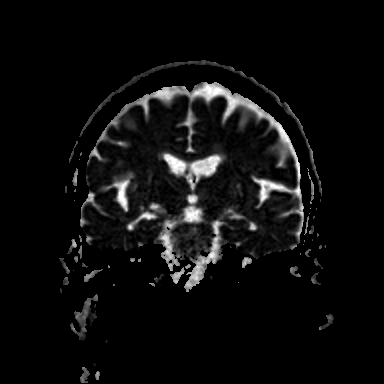
[im 38/38]
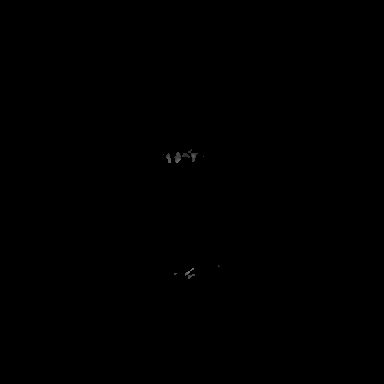

[Series 9: T1 · sagittal · 5.0mm · 0.62mm/px · 2 of 23 slices shown (1 of 2)]
[im 1/23]
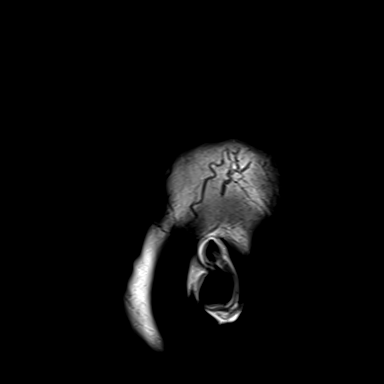
[im 23/23]
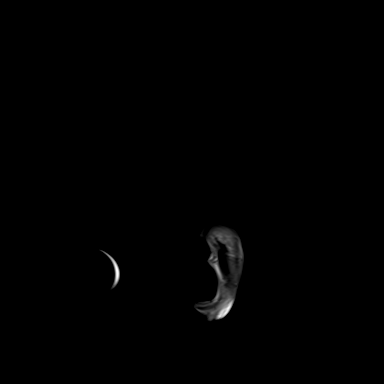

[Series 16: pha_images · axial · 3.0mm · 0.90mm/px · z∈[-67,+80]mm · 4 of 50 slices shown]
[im 1/50]
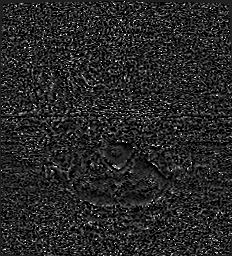
[im 17/50]
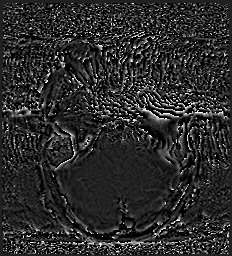
[im 33/50]
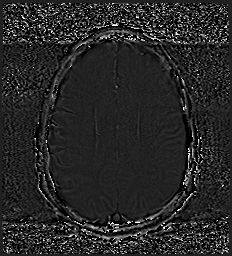
[im 50/50]
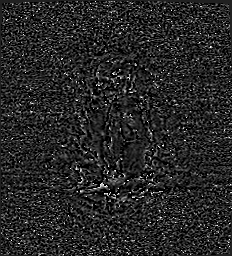

[Series 17: swi_images · axial · 3.0mm · 0.90mm/px · z∈[-67,+86]mm · 4 of 52 slices shown]
[im 1/52]
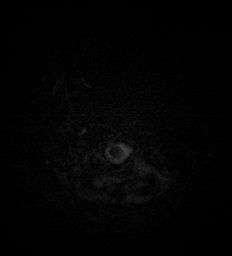
[im 18/52]
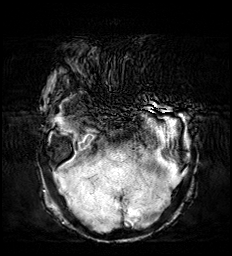
[im 35/52]
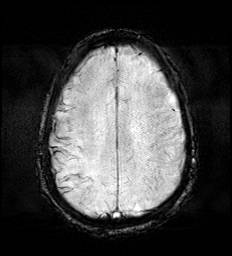
[im 52/52]
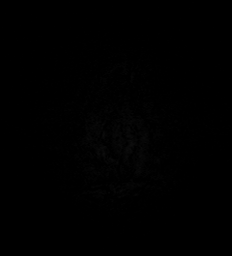

[Series 19: T2 · axial · 5.0mm · 0.53mm/px · z∈[-73,+83]mm · 2 of 27 slices shown (1 of 2)]
[im 1/27]
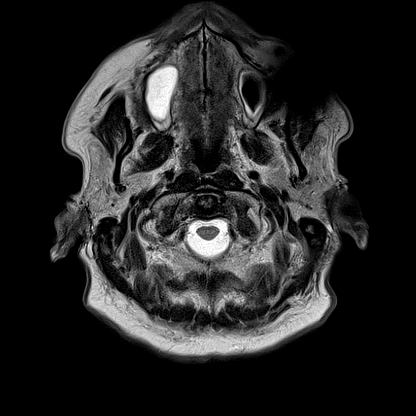
[im 27/27]
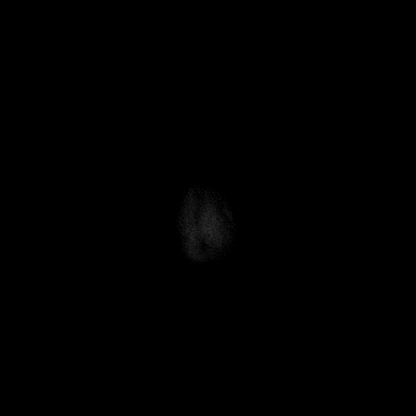

[Series 20: FLAIR · axial · 3.0mm · 0.53mm/px · z∈[-72,+81]mm · 4 of 52 slices shown]
[im 1/52]
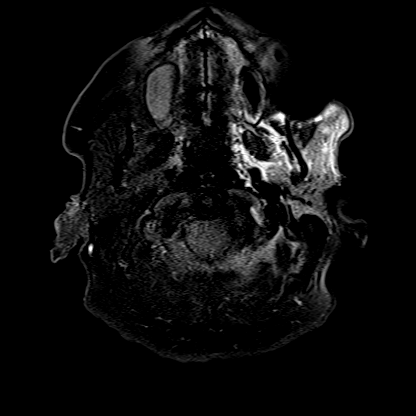
[im 18/52]
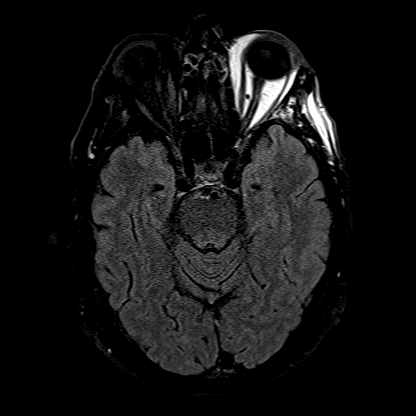
[im 35/52]
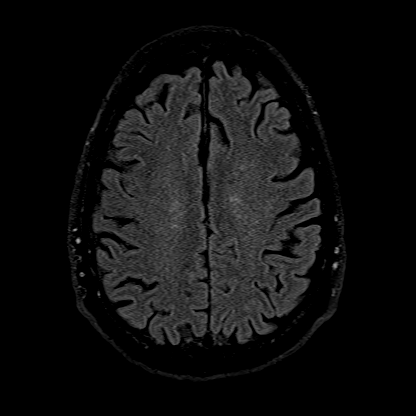
[im 52/52]
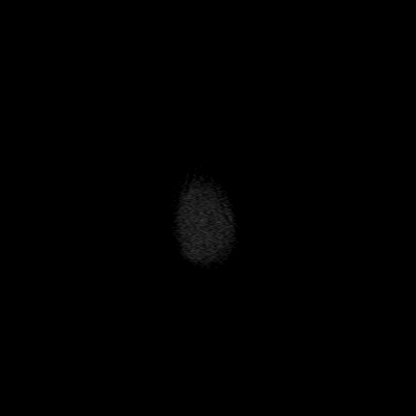

[Series 21: T1 · axial · 1.0mm · 0.98mm/px · z∈[-75,+84]mm · 10 of 160 slices shown (2 of 2)]
[im 1/160]
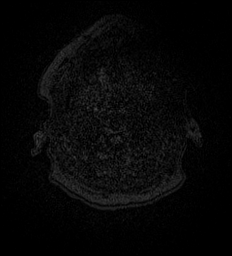
[im 13/160]
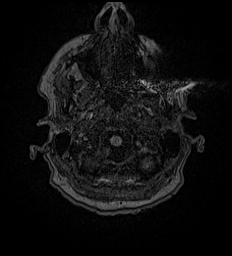
[im 25/160]
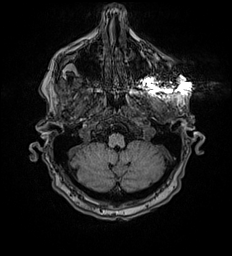
[im 37/160]
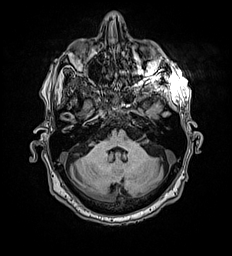
[im 49/160]
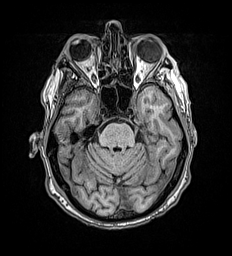
[im 74/160]
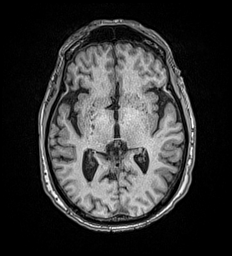
[im 86/160]
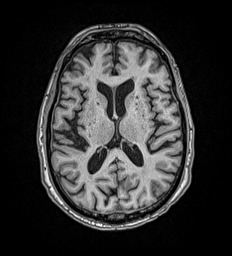
[im 111/160]
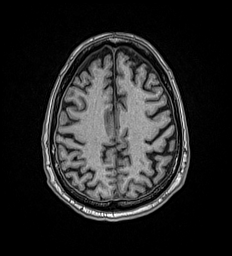
[im 135/160]
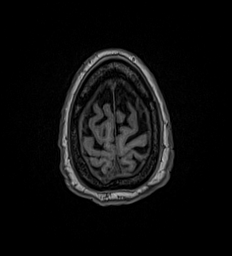
[im 160/160]
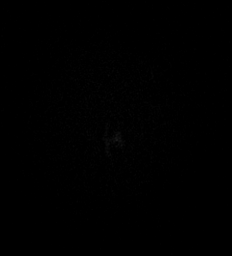

[Series 22: T2 · coronal · 5.0mm · 0.57mm/px · 3 of 30 slices shown (2 of 2)]
[im 1/30]
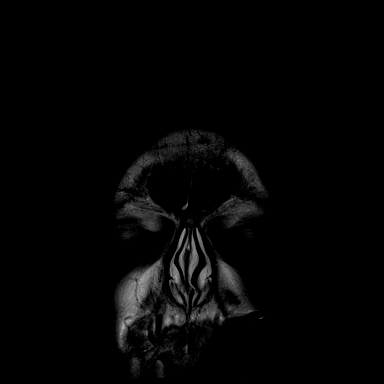
[im 15/30]
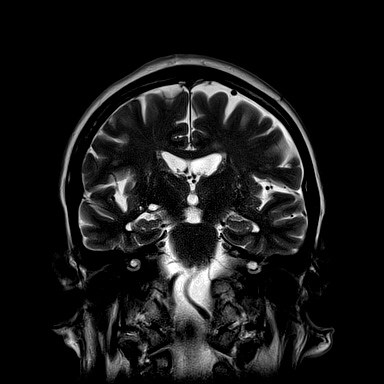
[im 30/30]
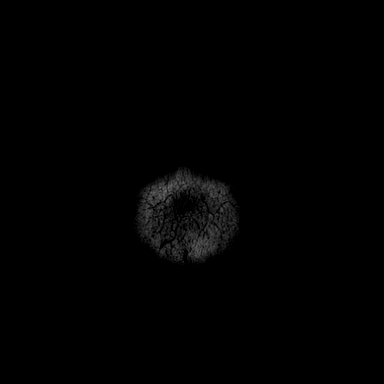

[44 of 48 positions shown; findings below may reference images not displayed]

FINDINGS: Brain: Superficial siderosis along the right cerebral convexity.
There was an ipsilateral subdural hematoma on the comparison head
CT, presumably siderosis is related to the same insult. No residual
subdural collection by noncontrast MRI, only subtle dural thickening
which is likely the patient's baseline. Brain volume is normal for
age. Mild chronic small vessel ischemia in the hemispheric white
matter.

No acute or subacute infarct, hydrocephalus, or mass.

Vascular: Normal flow voids

Skull and upper cervical spine: Normal marrow signal

Sinuses/Orbits: Left cataract resection. Paranasal sinus mucosal
thickening greatest at the right maxilla.
IMPRESSION: Superficial siderosis along the right cerebral convexity,
ipsilateral to a subdural hematoma seen in 0205.

No reversible finding or recent insult.

## 2023-09-15 DIAGNOSIS — D696 Thrombocytopenia, unspecified: Secondary | ICD-10-CM | POA: Diagnosis not present

## 2023-09-15 DIAGNOSIS — Z23 Encounter for immunization: Secondary | ICD-10-CM | POA: Diagnosis not present

## 2023-09-15 DIAGNOSIS — I10 Essential (primary) hypertension: Secondary | ICD-10-CM | POA: Diagnosis not present

## 2023-09-15 DIAGNOSIS — M109 Gout, unspecified: Secondary | ICD-10-CM | POA: Diagnosis not present

## 2023-09-15 DIAGNOSIS — Z1211 Encounter for screening for malignant neoplasm of colon: Secondary | ICD-10-CM | POA: Diagnosis not present

## 2023-10-17 DIAGNOSIS — Z1212 Encounter for screening for malignant neoplasm of rectum: Secondary | ICD-10-CM | POA: Diagnosis not present

## 2023-10-17 DIAGNOSIS — Z1211 Encounter for screening for malignant neoplasm of colon: Secondary | ICD-10-CM | POA: Diagnosis not present

## 2023-10-20 ENCOUNTER — Encounter: Payer: Self-pay | Admitting: Physician Assistant

## 2024-03-15 DIAGNOSIS — M109 Gout, unspecified: Secondary | ICD-10-CM | POA: Diagnosis not present

## 2024-03-15 DIAGNOSIS — R7303 Prediabetes: Secondary | ICD-10-CM | POA: Diagnosis not present

## 2024-03-15 DIAGNOSIS — Z139 Encounter for screening, unspecified: Secondary | ICD-10-CM | POA: Diagnosis not present

## 2024-03-15 DIAGNOSIS — Z9181 History of falling: Secondary | ICD-10-CM | POA: Diagnosis not present

## 2024-03-15 DIAGNOSIS — Z23 Encounter for immunization: Secondary | ICD-10-CM | POA: Diagnosis not present

## 2024-03-15 DIAGNOSIS — I10 Essential (primary) hypertension: Secondary | ICD-10-CM | POA: Diagnosis not present

## 2024-03-15 DIAGNOSIS — N183 Chronic kidney disease, stage 3 unspecified: Secondary | ICD-10-CM | POA: Diagnosis not present

## 2024-03-15 DIAGNOSIS — D696 Thrombocytopenia, unspecified: Secondary | ICD-10-CM | POA: Diagnosis not present

## 2024-07-26 DIAGNOSIS — R6889 Other general symptoms and signs: Secondary | ICD-10-CM | POA: Diagnosis not present

## 2024-07-26 DIAGNOSIS — H6122 Impacted cerumen, left ear: Secondary | ICD-10-CM | POA: Diagnosis not present

## 2024-07-26 DIAGNOSIS — H6692 Otitis media, unspecified, left ear: Secondary | ICD-10-CM | POA: Diagnosis not present
